# Patient Record
Sex: Male | Born: 1985 | Hispanic: Yes | Marital: Single | Smoking: Current every day smoker
Health system: Southern US, Community
[De-identification: ages and names within clinical notes are randomized; demographics above are authoritative.]

## PROBLEM LIST (undated history)

## (undated) HISTORY — PX: ANKLE FRACTURE SURGERY: SHX122

---

## 2018-08-15 ENCOUNTER — Encounter (HOSPITAL_COMMUNITY): Payer: Self-pay | Admitting: *Deleted

## 2018-08-15 ENCOUNTER — Other Ambulatory Visit: Payer: Self-pay

## 2018-08-15 ENCOUNTER — Emergency Department (HOSPITAL_COMMUNITY)

## 2018-08-15 ENCOUNTER — Emergency Department (HOSPITAL_COMMUNITY)
Admission: EM | Admit: 2018-08-15 | Discharge: 2018-08-15 | Disposition: A | Discharge status: Home / Self Care | Attending: Emergency Medicine | Admitting: Emergency Medicine

## 2018-08-15 DIAGNOSIS — S301XXA Contusion of abdominal wall, initial encounter: Secondary | ICD-10-CM | POA: Diagnosis not present

## 2018-08-15 DIAGNOSIS — F17200 Nicotine dependence, unspecified, uncomplicated: Secondary | ICD-10-CM | POA: Insufficient documentation

## 2018-08-15 DIAGNOSIS — W208XXA Other cause of strike by thrown, projected or falling object, initial encounter: Secondary | ICD-10-CM | POA: Diagnosis not present

## 2018-08-15 DIAGNOSIS — Y990 Civilian activity done for income or pay: Secondary | ICD-10-CM | POA: Diagnosis not present

## 2018-08-15 DIAGNOSIS — Y9269 Other specified industrial and construction area as the place of occurrence of the external cause: Secondary | ICD-10-CM | POA: Diagnosis not present

## 2018-08-15 DIAGNOSIS — Y93H3 Activity, building and construction: Secondary | ICD-10-CM | POA: Diagnosis not present

## 2018-08-15 DIAGNOSIS — S3991XA Unspecified injury of abdomen, initial encounter: Secondary | ICD-10-CM | POA: Diagnosis present

## 2018-08-15 LAB — COMPREHENSIVE METABOLIC PANEL
ALBUMIN: 3.9 g/dL (ref 3.5–5.0)
ALT: 34 U/L (ref 0–44)
ANION GAP: 6 (ref 5–15)
AST: 32 U/L (ref 15–41)
Alkaline Phosphatase: 56 U/L (ref 38–126)
BUN: 11 mg/dL (ref 6–20)
CALCIUM: 8.8 mg/dL — AB (ref 8.9–10.3)
CHLORIDE: 110 mmol/L (ref 98–111)
CO2: 22 mmol/L (ref 22–32)
Creatinine, Ser: 0.76 mg/dL (ref 0.61–1.24)
GFR calc Af Amer: >60 mL/min (ref >60)
GFR calc non Af Amer: >60 mL/min (ref >60)
GLUCOSE: 109 mg/dL — AB (ref 70–99)
POTASSIUM: 4.1 mmol/L (ref 3.5–5.1)
SODIUM: 138 mmol/L (ref 135–145)
Total Bilirubin: 0.6 mg/dL (ref 0.3–1.2)
Total Protein: 6.9 g/dL (ref 6.5–8.1)

## 2018-08-15 LAB — URINALYSIS, ROUTINE W REFLEX MICROSCOPIC
BACTERIA UA: NONE SEEN
BILIRUBIN URINE: NEGATIVE
Glucose, UA: NEGATIVE mg/dL
KETONES UR: NEGATIVE mg/dL
LEUKOCYTES UA: NEGATIVE
Nitrite: NEGATIVE
PH: 5 (ref 5.0–8.0)
Protein, ur: NEGATIVE mg/dL

## 2018-08-15 LAB — CBC
HCT: 45.6 % (ref 39.0–52.0)
HEMOGLOBIN: 14.7 g/dL (ref 13.0–17.0)
MCH: 28.2 pg (ref 26.0–34.0)
MCHC: 32.2 g/dL (ref 30.0–36.0)
MCV: 87.4 fL (ref 80.0–100.0)
NRBC: 0 % (ref 0.0–0.2)
PLATELETS: 136 10*3/uL — AB (ref 150–400)
RBC: 5.22 MIL/uL (ref 4.22–5.81)
RDW: 13.2 % (ref 11.5–15.5)
WBC: 7.9 10*3/uL (ref 4.0–10.5)

## 2018-08-15 LAB — PROTIME-INR
INR: 0.97
Prothrombin Time: 12.8 seconds (ref 11.4–15.2)

## 2018-08-15 LAB — I-STAT CHEM 8, ED
BUN: 13 mg/dL (ref 6–20)
CALCIUM ION: 1.14 mmol/L — AB (ref 1.15–1.40)
CHLORIDE: 106 mmol/L (ref 98–111)
Creatinine, Ser: 0.8 mg/dL (ref 0.61–1.24)
GLUCOSE: 106 mg/dL — AB (ref 70–99)
HCT: 44 % (ref 39.0–52.0)
HEMOGLOBIN: 15 g/dL (ref 13.0–17.0)
Potassium: 4.2 mmol/L (ref 3.5–5.1)
SODIUM: 140 mmol/L (ref 135–145)
TCO2: 24 mmol/L (ref 22–32)

## 2018-08-15 LAB — ETHANOL: Alcohol, Ethyl (B): <10 mg/dL (ref <10)

## 2018-08-15 LAB — CDS SEROLOGY

## 2018-08-15 LAB — SAMPLE TO BLOOD BANK

## 2018-08-15 LAB — I-STAT CG4 LACTIC ACID, ED: Lactic Acid, Venous: 1.75 mmol/L (ref 0.5–1.9)

## 2018-08-15 LAB — CK: CK TOTAL: 334 U/L (ref 49–397)

## 2018-08-15 MED ORDER — CYCLOBENZAPRINE HCL 10 MG PO TABS: 10.0000 mg | ORAL_TABLET | Freq: Two times a day (BID) | ORAL | 0 refills | Status: AC | PRN

## 2018-08-15 MED ORDER — IBUPROFEN 800 MG PO TABS: 800.0000 mg | ORAL_TABLET | Freq: Three times a day (TID) | ORAL | 0 refills | Status: AC

## 2018-08-15 MED ORDER — ONDANSETRON HCL 4 MG/2ML IJ SOLN: 4.0000 mg | Freq: Three times a day (TID) | INTRAMUSCULAR | Status: DC | PRN

## 2018-08-15 MED ORDER — SODIUM CHLORIDE 0.9 % IV SOLN
INTRAVENOUS | Status: DC
  Administered 2018-08-15: 15:00:00 via INTRAVENOUS

## 2018-08-15 MED ORDER — METHOCARBAMOL 1000 MG/10ML IJ SOLN
750.0000 mg | Freq: Once | INTRAVENOUS | Status: AC
  Administered 2018-08-15: 750 mg via INTRAVENOUS
  Filled 2018-08-15: qty 7.5

## 2018-08-15 MED ORDER — IOHEXOL 300 MG/ML  SOLN
100.0000 mL | Freq: Once | INTRAMUSCULAR | Status: AC | PRN
  Administered 2018-08-15: 100 mL via INTRAVENOUS

## 2018-08-15 MED ORDER — FENTANYL CITRATE (PF) 100 MCG/2ML IJ SOLN: 100.0000 ug | INTRAMUSCULAR | Status: DC | PRN

## 2018-08-15 MED ORDER — HYDROCODONE-ACETAMINOPHEN 5-325 MG PO TABS
1.0000 | ORAL_TABLET | Freq: Four times a day (QID) | ORAL | Status: DC | PRN
  Administered 2018-08-15: 1 via ORAL
  Filled 2018-08-15: qty 1

## 2018-08-15 MED ORDER — KETOROLAC TROMETHAMINE 30 MG/ML IJ SOLN
30.0000 mg | Freq: Four times a day (QID) | INTRAMUSCULAR | Status: DC | PRN
  Administered 2018-08-15: 30 mg via INTRAVENOUS
  Filled 2018-08-15: qty 1

## 2018-08-15 MED ORDER — HYDROMORPHONE HCL 1 MG/ML IJ SOLN: 1.0000 mg | INTRAMUSCULAR | Status: DC | PRN

## 2018-08-15 MED ORDER — HYDROMORPHONE HCL 1 MG/ML IJ SOLN
1.0000 mg | Freq: Once | INTRAMUSCULAR | Status: AC
  Administered 2018-08-15: 1 mg via INTRAVENOUS
  Filled 2018-08-15: qty 1

## 2018-08-15 MED ORDER — DIPHENHYDRAMINE HCL 25 MG PO CAPS
50.0000 mg | ORAL_CAPSULE | Freq: Once | ORAL | Status: AC
  Administered 2018-08-15: 50 mg via ORAL
  Filled 2018-08-15: qty 2

## 2018-08-15 NOTE — Discharge Instructions (Addendum)
Take 1000 mg of Tylenol every 6 hours for pain as well in addition to Motrin and Flexeril as prescribed.

## 2018-08-15 NOTE — ED Notes (Signed)
Attempting to sit patient up slowly

## 2018-08-15 NOTE — ED Notes (Signed)
Dr. Lindie Spruce at bedside. Patient is trying to sit up a little at a time.

## 2018-08-15 NOTE — ED Provider Notes (Signed)
Assumed care from Dr. Jeraldine Loots at 4 PM.  Patient being evaluated by trauma for possible admission for pain control.  Patient had crush injury today while at work and has a bad right sided abdominal contusion.  Trauma recommends further pain control while in the ED and reevaluate.  Patient was given opioids, muscle relaxant and had improvement of pain.  Patient was able to ambulate without any issues.  Patient appears to have a possible reaction to narcotic pain medicine and was given Benadryl.  He appeared to just have hives that resolved with Benadryl.  Therefore will give prescription for Motrin, lidocaine patch, Flexeril.  Recommend that he ice.  Patient was discharged in ED in good condition.  Understands return precautions.   Virgina Norfolk, DO 08/15/18 1308

## 2018-08-15 NOTE — ED Provider Notes (Signed)
[facility] EMERGENCY DEPARTMENT Provider Note   CSN: [id number] Arrival date & time: 08/15/18  1222     History   Chief Complaint Chief Complaint  Patient presents with  . Fall    HPI Samiel Peel is a 32 y.o. male.  HPI  Patient presents after a workplace accident. Patient was in an elevated left, when a steel beam broke loose, causing the left to fall.  The beam and fell upon the patient and another individual. The other individual had, reportedly, amputation's and partial amputations, went to another healthcare facility. This patient had been landed on his right abdomen, right flank. He denies other substantial injuries including head trauma, or any ongoing pain in his head, neck, chest. He does have severe pain in his right abdomen, right flank, right lateral superior hip. All pain is worse with any motion. Patient arrives with EMS transport, in a steel basket, used to lower him from the scene. Patient states that he is generally well, was well prior to the event. EMS notes the patient was awake and alert throughout transport, required 200 mcg of fentanyl for pain control.   History reviewed. No pertinent past medical history.  There are no active problems to display for this patient.   Past Surgical History:  Procedure Laterality Date  . ANKLE FRACTURE SURGERY          Home Medications    Prior to Admission medications   Not on File    Family History No family history on file.  Social History Social History   Tobacco Use  . Smoking status: Current Every Day Smoker  . Smokeless tobacco: Never Used  Substance Use Topics  . Alcohol use: Yes  . Drug use: Never     Allergies   Patient has no allergy information on record.   Review of Systems Review of Systems  Constitutional:       Per HPI, otherwise negative  HENT:       Per HPI, otherwise negative  Respiratory:       Per HPI, otherwise negative  Cardiovascular:     Per HPI, otherwise negative  Gastrointestinal: Positive for abdominal pain and nausea. Negative for vomiting.  Endocrine:       Negative aside from HPI  Genitourinary:       Neg aside from HPI   Musculoskeletal:       Per HPI, otherwise negative  Skin: Negative.   Neurological: Negative for syncope.     Physical Exam Updated Vital Signs BP (!) 148/88   Pulse 87   Temp (!) 97.2 F (36.2 C) (Temporal)   Resp 14   Ht 5\' 4"  (1.626 m)   Wt 90.7 kg   SpO2 100%   BMI 34.33 kg/m   Physical Exam  Constitutional: He is oriented to person, place, and time. He appears well-developed. No distress.  HENT:  Head: Normocephalic and atraumatic.  Eyes: Conjunctivae and EOM are normal.  Neck: Neck supple.  Cardiovascular: Normal rate and regular rhythm.  Pulmonary/Chest: Effort normal. No stridor. No respiratory distress.  Abdominal: He exhibits no distension.  Tenderness to palpation throughout the right abdomen laterally and right flank, without obvious discoloration.  Musculoskeletal: He exhibits no edema.  Pelvis is grossly stable, patient flexes the left hip spontaneously, independently, but the right hip he is hesitant to move secondary to pain in the right flank. No crepitus, no obvious deformity. Both knees, ankles unremarkable.  Neurological: He is alert and oriented to  person, place, and time.  Skin: Skin is warm and dry.  Psychiatric: His mood appears anxious.  Nursing note and vitals reviewed.    ED Treatments / Results  Labs (all labs ordered are listed, but only abnormal results are displayed) Labs Reviewed  COMPREHENSIVE METABOLIC PANEL - Abnormal; Notable for the following components:      Result Value   Glucose, Bld 109 (*)    Calcium 8.8 (*)    All other components within normal limits  CBC - Abnormal; Notable for the following components:   Platelets 136 (*)    All other components within normal limits  I-STAT CHEM 8, ED - Abnormal; Notable for the  following components:   Glucose, Bld 106 (*)    Calcium, Ion 1.14 (*)    All other components within normal limits  CDS SEROLOGY  ETHANOL  PROTIME-INR  URINALYSIS, ROUTINE W REFLEX MICROSCOPIC  I-STAT CG4 LACTIC ACID, ED  SAMPLE TO BLOOD BANK    Radiology Ct Abdomen Pelvis W Contrast  Result Date: 08/15/2018 CLINICAL DATA:  32 year old male with right flank and lower abdominal pain after a large beam fell on him at work. EXAM: CT ABDOMEN AND PELVIS WITH CONTRAST TECHNIQUE: Multidetector CT imaging of the abdomen and pelvis was performed using the standard protocol following bolus administration of intravenous contrast. CONTRAST:  OMNIPAQUE IOHEXOL 300 MG/ML  SOLN COMPARISON:  None. FINDINGS: Lower chest: The lung bases are clear. Visualized cardiac structures are within normal limits for size. No pericardial effusion. Unremarkable visualized distal thoracic esophagus. Hepatobiliary: Diffuse low attenuation of the hepatic parenchyma. There is subtle sparing around the gallbladder fossa. No discrete hepatic lesion or evidence of laceration. Gallbladder is unremarkable. No intra or extrahepatic biliary ductal dilatation. Pancreas: Unremarkable. No pancreatic ductal dilatation or surrounding inflammatory changes. Spleen: Normal in size without focal abnormality. Adrenals/Urinary Tract: Adrenal glands are unremarkable. Kidneys are normal, without renal calculi, focal lesion, or hydronephrosis. Bladder is unremarkable. Stomach/Bowel: Stomach is within normal limits. Appendix appears normal. No evidence of bowel wall thickening, distention, or inflammatory changes. Vascular/Lymphatic: No significant vascular findings are present. No enlarged abdominal or pelvic lymph nodes. Reproductive: Prostate is unremarkable. Other: None.  No free fluid or hernia. Musculoskeletal: No acute fracture or aggressive appearing lytic or blastic osseous lesion. Faint reticulation of the subcutaneous fat overlying the  right back/flank consistent with contusion. IMPRESSION: 1. No evidence of acute injury to the abdomen or pelvis. 2. Very mild reticulation of the subcutaneous fat overlying the right back/flank consistent with soft tissue contusion. No evidence of underlying fracture. 3. Hepatic steatosis. Electronically Signed   By: Malachy Moan M.D.   On: 08/15/2018 13:11   Dg Pelvis Portable  Result Date: 08/15/2018 CLINICAL DATA:  Right low back and lower abdomen pain after a steel beam fell on the patient. EXAM: PORTABLE PELVIS 1-2 VIEWS COMPARISON:  None. FINDINGS: The pelvis and lower the left. Lumbar spine are rotated to no fracture or dislocation seen. IMPRESSION: No fracture or dislocation. Electronically Signed   By: Beckie Salts M.D.   On: 08/15/2018 12:47   Dg Chest Port 1 View  Result Date: 08/15/2018 CLINICAL DATA:  Back pain after a steel beam fell on the patient. EXAM: PORTABLE CHEST 1 VIEW COMPARISON:  None. FINDINGS: Very poor inspiration. Grossly normal sized heart. Clear lungs. Mild to moderate diffuse peribronchial thickening. Left mid clavicle fracture versus artifact from overlying lead wires. Otherwise, no fracture or pneumothorax seen. IMPRESSION: 1. Left mid clavicle fracture versus artifact  from overlying lead wires. 2. Very poor inspiration with mild to moderate bronchitic changes. Electronically Signed   By: Beckie Salts M.D.   On: 08/15/2018 12:46    Procedures Procedures (including critical care time)  EMERGENCY DEPARTMENT [place] FAST EXAM "Limited Ultrasound of the Abdomen and Pericardium" (FAST Exam).   INDICATIONS:Blunt injury of abdomen Multiple views of the abdomen and pericardium are obtained with a multi-frequency probe.  PERFORMED BY: Myself IMAGES ARCHIVED?: No LIMITATIONS:  Body habitus and Emergent procedure INTERPRETATION:  No abdominal free fluid     Medications Ordered in ED Medications  fentaNYL (SUBLIMAZE) injection 100 mcg (has no administration in  time range)  ondansetron (ZOFRAN) injection 4 mg (has no administration in time range)  iohexol (OMNIPAQUE) 300 MG/ML solution 100 mL (100 mLs Intravenous Contrast Given 08/15/18 1251)  HYDROmorphone (DILAUDID) injection 1 mg (1 mg Intravenous Given 08/15/18 1501)     Initial Impression / Assessment and Plan / ED Course  I have reviewed the triage vital signs and the nursing notes.  Pertinent labs & imaging results that were available during my care of the patient were reviewed by me and considered in my medical decision making (see chart for details).  After the initial evaluation with consideration of pelvic fracture, intra-abdominal injury, the patient had stat x-ray, CT performed.    3:04 PM Patient remains in substantial pain, particularly with any attempt at sitting upright, or ambulating. I reviewed findings with the patient's family members who are now present at bedside. We discussed the crush injury, and need for ongoing pain medication. Patient remains incapable of walking, and in need of ongoing narcotics for pain control.  This previously well young male presents after sustaining a crush injury. Patient is awake and alert, but has substantial pain on arrival and throughout his ED course in spite of narcotics. Evaluation generally reassuring, with no obvious fracture, no abnormal initial FAST exam, and reassuring CT scan. However, patient continued to have severe substantial pain, requiring multiple doses of narcotics. Patient was seen and evaluated by our trauma team.  Final Clinical Impressions(s) / ED Diagnoses  Crush injury, initial encounter   Gerhard Munch, MD 08/15/18 [phone]

## 2018-08-15 NOTE — Progress Notes (Signed)
Orthopedic Tech Progress Note Patient Details:  Andrew Schmidt 10/17/1876 [id number]  Patient ID: [name], male   DOB: 10/17/1876, 32 y.o.   MRN: [id number]   Nikki Dom 08/15/2018, 12:26 PM Made level 2 trauma visit

## 2018-08-15 NOTE — ED Triage Notes (Signed)
Patient presents to ED via [facility] states he was working on a bridge , he was in a basket working on a 2 ton beam above them, the hook slipped and the beam fell down on him causing the basket to fall. He was able to ambulate post incident however he was starting to hurt worse.Alert oriented upon arrival . C/o right flank pain and right lower abd. Pain .

## 2018-08-15 NOTE — ED Notes (Signed)
Patient verbalizes understanding of discharge instructions. Opportunity for questioning and answers were provided. Armband removed by staff, pt discharged from ED with family in wheelchair.

## 2018-08-15 NOTE — ED Notes (Signed)
Pt standing at bedside- wanting to walk to bathroom- ambulated slowly

## 2018-08-15 NOTE — ED Notes (Signed)
Fast exam negative per Dr. Jeraldine Loots.

## 2018-08-15 NOTE — Consult Note (Signed)
[facility] Surgery Consult Note  Andrew Schmidt 1986-09-03  [id number].    Requesting MD: Vanita Panda Chief Complaint/Reason for Consult: R flank pain  HPI:  Patient is a 32 year old male who was brought in as a level 2 trauma after being pinned between a heavy beam and handle bar earlier this AM. Patient was working to build a bridge and a 2 ton beam came loose. Patient denies LOC. No pain at rest. Pain in R flank with movement. Denies nausea, chest pain, SOB. Feels hungry. CT scans negative for injury. Patient has been hemodynamically stable throughout time in ED.   Multiple family members present in [place].   ROS: Review of Systems  Respiratory: Negative for shortness of breath and wheezing.   Cardiovascular: Negative for chest pain and palpitations.  Gastrointestinal: Negative for abdominal pain.  Musculoskeletal: Positive for back pain (R flank). Negative for joint pain and neck pain.  Neurological: Negative for dizziness, loss of consciousness and headaches.  All other systems reviewed and are negative.   No family history on file.  History reviewed. No pertinent past medical history.  Past Surgical History:  Procedure Laterality Date  . ANKLE FRACTURE SURGERY      Social History:  reports that he has been smoking. He has never used smokeless tobacco. He reports that he drinks alcohol. He reports that he does not use drugs.  Allergies: Allergies not on file   (Not in a hospital admission)  Blood pressure (!) 126/110, pulse 93, temperature (!) 97.2 F (36.2 C), temperature source Temporal, resp. rate 18, height '5\' 4"'$  (1.626 m), weight 90.7 kg, SpO2 98 %. Physical Exam: Physical Exam  Constitutional: He is oriented to person, place, and time. He appears well-developed and well-nourished. He is cooperative.  Non-toxic appearance. No distress.  HENT:  Head: Normocephalic and atraumatic.  Right Ear: External ear normal.  Left Ear: External ear normal.  Nose: Nose  normal.  Mouth/Throat: Oropharynx is clear and moist and mucous membranes are normal.  Eyes: Pupils are equal, round, and reactive to light. Conjunctivae, EOM and lids are normal. No scleral icterus.  Neck: Normal range of motion and phonation normal. Neck supple. No tracheal deviation present.  Cardiovascular: Normal rate and regular rhythm.  Pulses:      Radial pulses are 2+ on the right side, and 2+ on the left side.       Dorsalis pedis pulses are 2+ on the right side, and 2+ on the left side.       Posterior tibial pulses are 2+ on the right side, and 2+ on the left side.  Pulmonary/Chest: Effort normal and breath sounds normal.  Abdominal: Soft. Bowel sounds are normal. He exhibits no distension. There is no hepatosplenomegaly. There is no tenderness. There is no rigidity, no rebound and no guarding.  Musculoskeletal:  Active muscle spasm in R back/flank; ROM slightly limited at R hip by pain; ROM intact at R knee and R ankle; ROM normal in BL UEs and LLE; sensation/motor intact in BL LEs  Neurological: He is alert and oriented to person, place, and time. He has normal strength. No sensory deficit. GCS eye subscore is 4. GCS verbal subscore is 5. GCS motor subscore is 6.  Skin: Skin is warm, dry and intact. No abrasion, no bruising, no ecchymosis and no laceration noted.  Psychiatric: He has a normal mood and affect. His speech is normal and behavior is normal.    Results for orders placed or performed during  the hospital encounter of 08/15/18 (from the past 48 hour(s))  CDS serology     Status: None   Collection Time: 08/15/18 12:40 PM  Result Value Ref Range   CDS serology specimen      SPECIMEN WILL BE HELD FOR 14 DAYS IF TESTING IS REQUIRED    Comment: Performed at [facility], [place] [address], [place], [place] [address]  Comprehensive metabolic panel     Status: Abnormal   Collection Time: 08/15/18 12:40 PM  Result Value Ref Range   Sodium 138 135 - 145 mmol/L    Potassium 4.1 3.5 - 5.1 mmol/L   Chloride 110 98 - 111 mmol/L   CO2 22 22 - 32 mmol/L   Glucose, Bld 109 (H) 70 - 99 mg/dL   BUN 11 6 - 20 mg/dL   Creatinine, Ser 0.76 0.61 - 1.24 mg/dL   Calcium 8.8 (L) 8.9 - 10.3 mg/dL   Total Protein 6.9 6.5 - 8.1 g/dL   Albumin 3.9 3.5 - 5.0 g/dL   AST 32 15 - 41 U/L   ALT 34 0 - 44 U/L   Alkaline Phosphatase 56 38 - 126 U/L   Total Bilirubin 0.6 0.3 - 1.2 mg/dL   GFR calc non Af Amer >60 >60 mL/min   GFR calc Af Amer >60 >60 mL/min    Comment: (NOTE) The eGFR has been calculated using the CKD EPI equation. This calculation has not been validated in all clinical situations. eGFR's persistently <60 mL/min signify possible Chronic Kidney Disease.    Anion gap 6 5 - 15    Comment: Performed at [facility] [address], [place], [place] [address]  CBC     Status: Abnormal   Collection Time: 08/15/18 12:40 PM  Result Value Ref Range   WBC 7.9 4.0 - 10.5 K/uL   RBC 5.22 4.22 - 5.81 MIL/uL   Hemoglobin 14.7 13.0 - 17.0 g/dL   HCT 45.6 39.0 - 52.0 %   MCV 87.4 80.0 - 100.0 fL   MCH 28.2 26.0 - 34.0 pg   MCHC 32.2 30.0 - 36.0 g/dL   RDW 13.2 11.5 - 15.5 %   Platelets 136 (L) 150 - 400 K/uL   nRBC 0.0 0.0 - 0.2 %    Comment: Performed at [facility], [place] [address] [place], [place] [address]  Ethanol     Status: None   Collection Time: 08/15/18 12:40 PM  Result Value Ref Range   Alcohol, Ethyl (B) <10 <10 mg/dL    Comment: (NOTE) Lowest detectable limit for serum alcohol is 10 mg/dL. For medical purposes only. Performed at [facility], [place] [address] [place], [place] [address]   Protime-INR     Status: None   Collection Time: 08/15/18 12:40 PM  Result Value Ref Range   Prothrombin Time 12.8 11.4 - 15.2 seconds   INR 0.97     Comment: Performed at [facility] [address], [place], [place] [address]  Sample to Blood Bank     Status: None   Collection Time: 08/15/18 12:40 PM  Result Value Ref Range    Blood Bank Specimen SAMPLE AVAILABLE FOR TESTING    Sample Expiration      08/16/2018 Performed at [facility], [place] [address], [place], [place] [address]   I-Stat Chem 8, ED     Status: Abnormal   Collection Time: 08/15/18 12:55 PM  Result Value Ref Range  Sodium 140 135 - 145 mmol/L   Potassium 4.2 3.5 - 5.1 mmol/L   Chloride 106 98 - 111 mmol/L   BUN 13 6 - 20 mg/dL   Creatinine, Ser 0.80 0.61 - 1.24 mg/dL   Glucose, Bld 106 (H) 70 - 99 mg/dL   Calcium, Ion 1.14 (L) 1.15 - 1.40 mmol/L   TCO2 24 22 - 32 mmol/L   Hemoglobin 15.0 13.0 - 17.0 g/dL   HCT 44.0 39.0 - 52.0 %  I-Stat CG4 Lactic Acid, ED     Status: None   Collection Time: 08/15/18 12:55 PM  Result Value Ref Range   Lactic Acid, Venous 1.75 0.5 - 1.9 mmol/L   Ct Abdomen Pelvis W Contrast  Result Date: 08/15/2018 CLINICAL DATA:  32 year old male with right flank and lower abdominal pain after a large beam fell on him at work. EXAM: CT ABDOMEN AND PELVIS WITH CONTRAST TECHNIQUE: Multidetector CT imaging of the abdomen and pelvis was performed using the standard protocol following bolus administration of intravenous contrast. CONTRAST:  12m OMNIPAQUE IOHEXOL 300 MG/ML  SOLN COMPARISON:  None. FINDINGS: Lower chest: The lung bases are clear. Visualized cardiac structures are within normal limits for size. No pericardial effusion. Unremarkable visualized distal thoracic esophagus. Hepatobiliary: Diffuse low attenuation of the hepatic parenchyma. There is subtle sparing around the gallbladder fossa. No discrete hepatic lesion or evidence of laceration. Gallbladder is unremarkable. No intra or extrahepatic biliary ductal dilatation. Pancreas: Unremarkable. No pancreatic ductal dilatation or surrounding inflammatory changes. Spleen: Normal in size without focal abnormality. Adrenals/Urinary Tract: Adrenal glands are unremarkable. Kidneys are normal, without renal calculi, focal lesion, or hydronephrosis. Bladder is  unremarkable. Stomach/Bowel: Stomach is within normal limits. Appendix appears normal. No evidence of bowel wall thickening, distention, or inflammatory changes. Vascular/Lymphatic: No significant vascular findings are present. No enlarged abdominal or pelvic lymph nodes. Reproductive: Prostate is unremarkable. Other: None.  No free fluid or hernia. Musculoskeletal: No acute fracture or aggressive appearing lytic or blastic osseous lesion. Faint reticulation of the subcutaneous fat overlying the right back/flank consistent with contusion. IMPRESSION: 1. No evidence of acute injury to the abdomen or pelvis. 2. Very mild reticulation of the subcutaneous fat overlying the right back/flank consistent with soft tissue contusion. No evidence of underlying fracture. 3. Hepatic steatosis. Electronically Signed   By: HJacqulynn CadetM.D.   On: 08/15/2018 13:11   Dg Pelvis Portable  Result Date: 08/15/2018 CLINICAL DATA:  Right low back and lower abdomen pain after a steel beam fell on the patient. EXAM: PORTABLE PELVIS 1-2 VIEWS COMPARISON:  None. FINDINGS: The pelvis and lower the left. Lumbar spine are rotated to no fracture or dislocation seen. IMPRESSION: No fracture or dislocation. Electronically Signed   By: SClaudie ReveringM.D.   On: 08/15/2018 12:47   Dg Chest Port 1 View  Result Date: 08/15/2018 CLINICAL DATA:  Back pain after a steel beam fell on the patient. EXAM: PORTABLE CHEST 1 VIEW COMPARISON:  None. FINDINGS: Very poor inspiration. Grossly normal sized heart. Clear lungs. Mild to moderate diffuse peribronchial thickening. Left mid clavicle fracture versus artifact from overlying lead wires. Otherwise, no fracture or pneumothorax seen. IMPRESSION: 1. Left mid clavicle fracture versus artifact from overlying lead wires. 2. Very poor inspiration with mild to moderate bronchitic changes. Electronically Signed   By: SClaudie ReveringM.D.   On: 08/15/2018 12:46      Assessment/Plan Struck by beam R  flank/back pain - Imaging negative for actual injury and exam benign; feels  like spasm, try robaxin and toradol and mobilize after pain controlled. If patient able to mobilize can go home. If pain remains uncontrolled and unable to mobilize can admit for pain control PT/OT.   Ok to [place]. Please call on-call attending provider for trauma if unable to mobilize.   Brigid Re, [facility] Surgery 08/15/2018, 3:49 PM Pager: [phone] Mon-Fri 7:00 am-4:30 pm Sat-Sun 7:00 am-11:30 am

## 2018-08-15 NOTE — ED Notes (Signed)
Pt developing hives over face- EDP notified

## 2018-08-15 NOTE — ED Notes (Signed)
Hives are fading, no resp distress

## 2020-06-20 IMAGING — DX DG CHEST 1V PORT
2 series · 2 of 2 positions shown · non-contrast
Comparison: None.

CLINICAL DATA: Back pain after a steel beam fell on the patient.

EXAM:
PORTABLE CHEST 1 VIEW

[chest ap (1 of 2)]
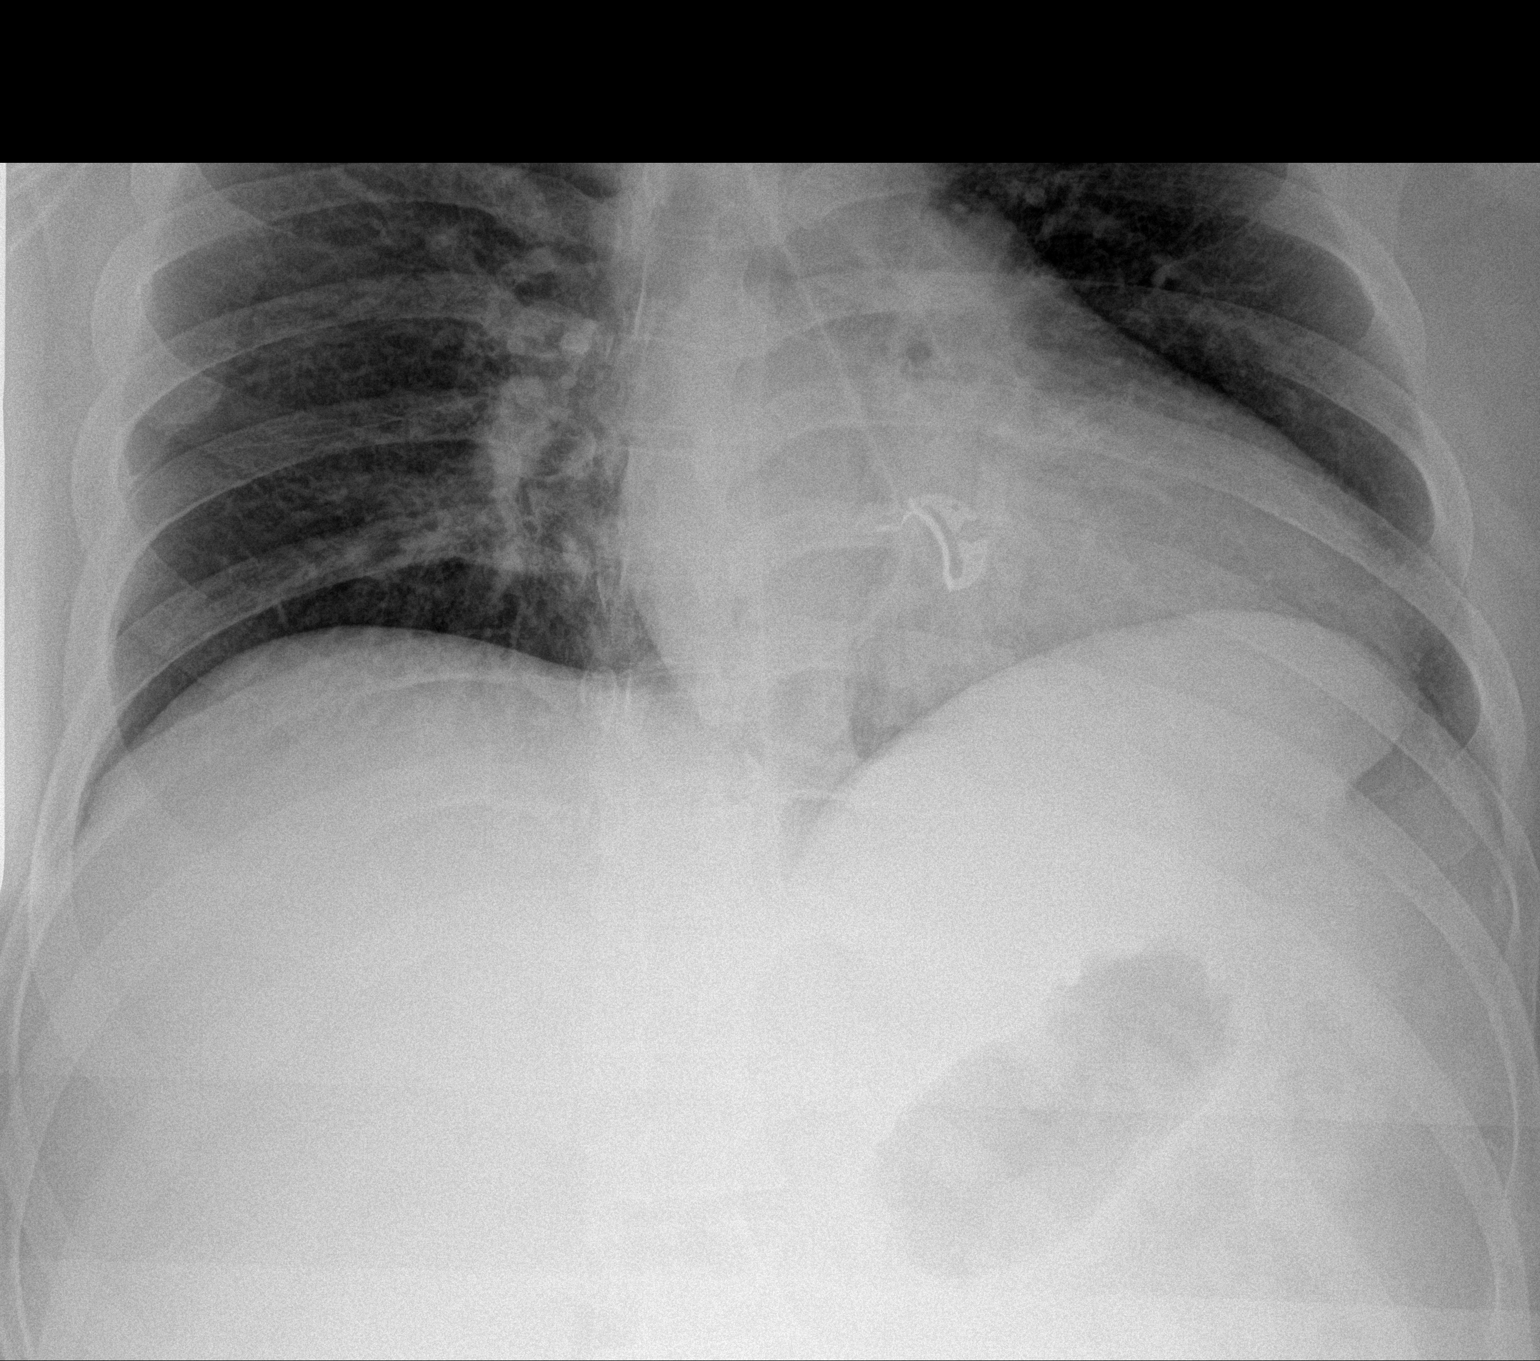

[chest ap (2 of 2)]
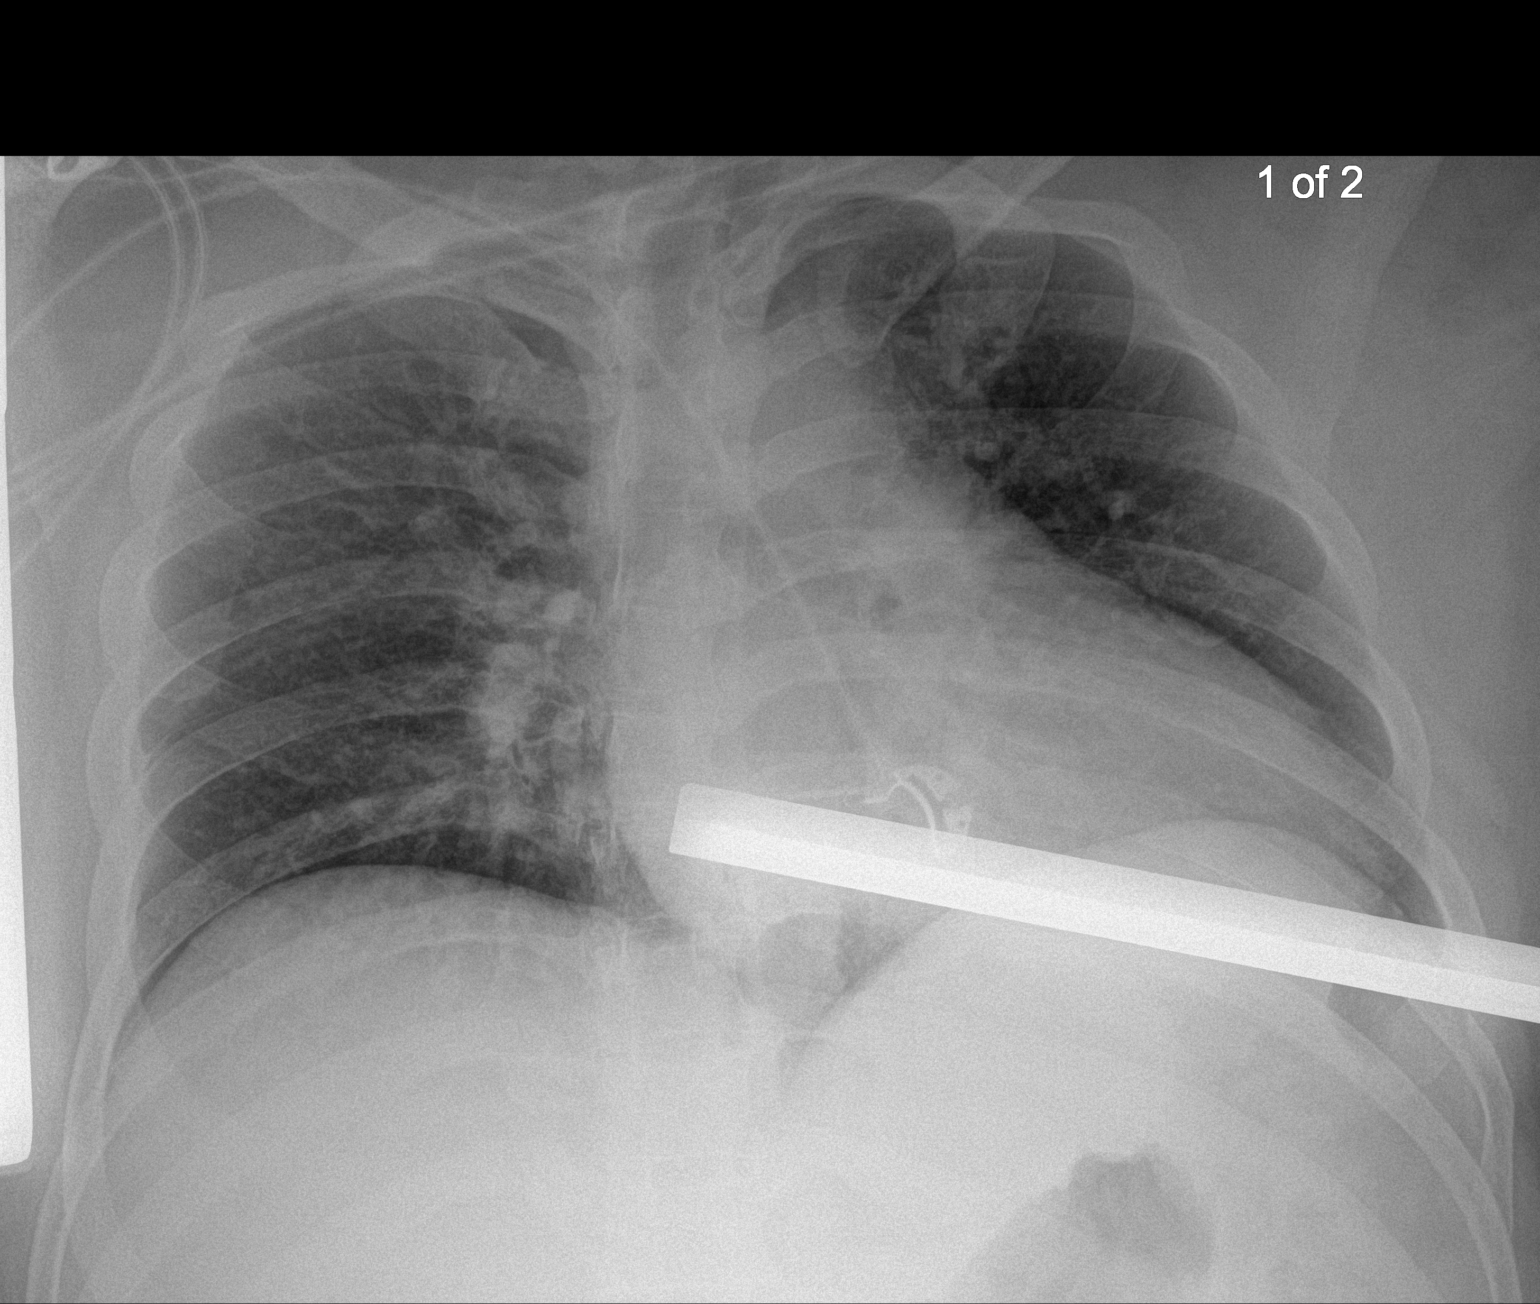

[2 of 2 positions shown; findings below may reference images not displayed]

FINDINGS: Very poor inspiration. Grossly normal sized heart. Clear lungs. Mild
to moderate diffuse peribronchial thickening. Left mid clavicle
fracture versus artifact from overlying lead wires. Otherwise, no
fracture or pneumothorax seen.
IMPRESSION: 1. Left mid clavicle fracture versus artifact from overlying lead
wires.
2. Very poor inspiration with mild to moderate bronchitic changes.

## 2020-06-20 IMAGING — DX DG PORTABLE PELVIS
1 series · 1 of 1 positions shown · non-contrast
Comparison: None.

CLINICAL DATA: Right low back and lower abdomen pain after a steel
beam fell on the patient.

EXAM:
PORTABLE PELVIS 1-2 VIEWS

[pelvis ap]
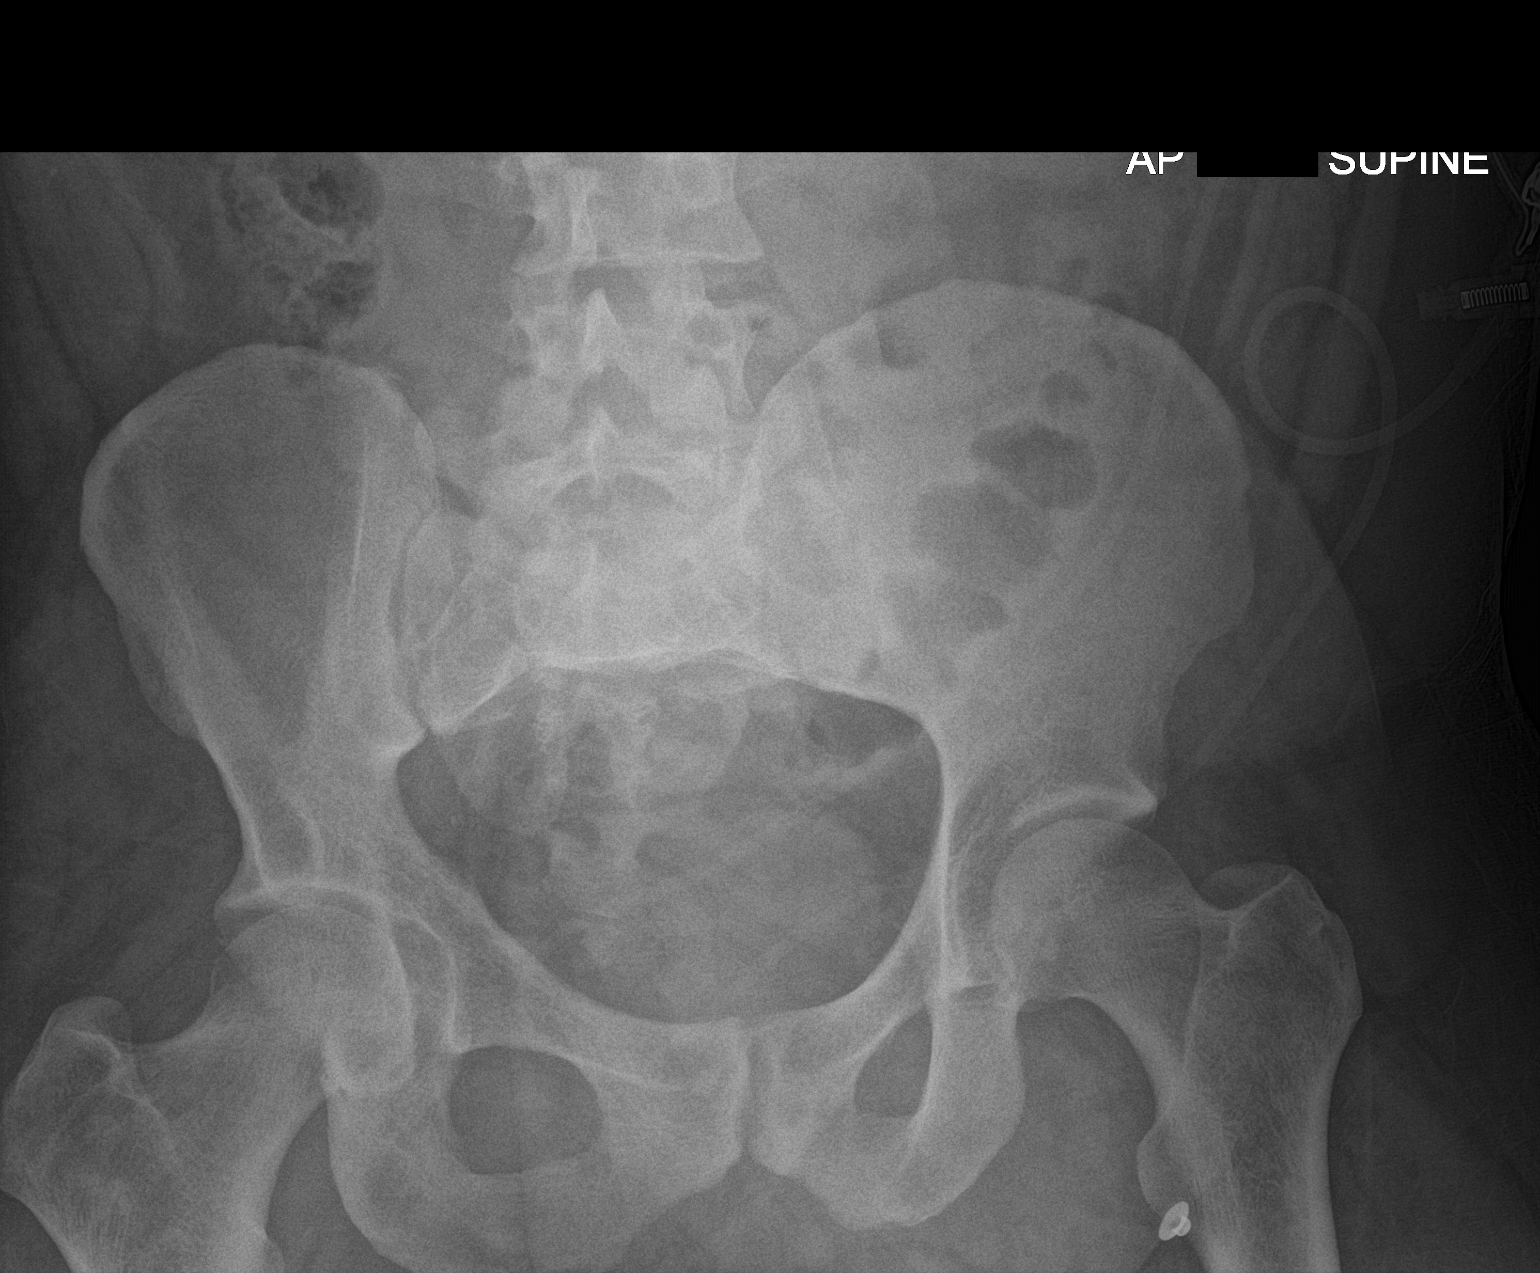

[1 of 1 positions shown; findings below may reference images not displayed]

FINDINGS: The pelvis and lower the left. Lumbar spine are rotated to no
fracture or dislocation seen.
IMPRESSION: No fracture or dislocation.
